# Patient Record
Sex: Male | Born: 1995 | Race: Black or African American | Hispanic: No | Marital: Single | Smoking: Never smoker
Health system: Southern US, Community
[De-identification: ages and names within clinical notes are randomized; demographics above are authoritative.]

---
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
## 2017-01-25 ENCOUNTER — Emergency Department (HOSPITAL_BASED_OUTPATIENT_CLINIC_OR_DEPARTMENT_OTHER)
Admission: EM | Admit: 2017-01-25 | Discharge: 2017-01-25 | Disposition: A | Discharge status: Home / Self Care | Payer: No Typology Code available for payment source | Attending: Emergency Medicine | Admitting: Emergency Medicine

## 2017-01-25 ENCOUNTER — Encounter (HOSPITAL_BASED_OUTPATIENT_CLINIC_OR_DEPARTMENT_OTHER): Payer: Self-pay

## 2017-01-25 DIAGNOSIS — Y999 Unspecified external cause status: Secondary | ICD-10-CM | POA: Diagnosis not present

## 2017-01-25 DIAGNOSIS — T148XXA Other injury of unspecified body region, initial encounter: Secondary | ICD-10-CM

## 2017-01-25 DIAGNOSIS — Y929 Unspecified place or not applicable: Secondary | ICD-10-CM | POA: Diagnosis not present

## 2017-01-25 DIAGNOSIS — Y939 Activity, unspecified: Secondary | ICD-10-CM | POA: Diagnosis not present

## 2017-01-25 DIAGNOSIS — S29012A Strain of muscle and tendon of back wall of thorax, initial encounter: Secondary | ICD-10-CM | POA: Insufficient documentation

## 2017-01-25 DIAGNOSIS — S3992XA Unspecified injury of lower back, initial encounter: Secondary | ICD-10-CM | POA: Diagnosis present

## 2017-01-25 NOTE — Discharge Instructions (Signed)
You can take 600 mg of ibuprofen 3-4 times a day as needed OR 220-440mg  naproxen twice a day. Also use heat therapy, massage therapy, stretching exercises, and topical creams such as [employer]akcey Hot, TryonSalonpas, MoultonBengay.
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
## 2017-10-22 ENCOUNTER — Other Ambulatory Visit (INDEPENDENT_AMBULATORY_CARE_PROVIDER_SITE_OTHER): Payer: 59

## 2017-10-22 ENCOUNTER — Encounter: Payer: Self-pay | Admitting: Internal Medicine

## 2017-10-22 ENCOUNTER — Ambulatory Visit (INDEPENDENT_AMBULATORY_CARE_PROVIDER_SITE_OTHER): Payer: 59 | Admitting: Internal Medicine

## 2017-10-22 VITALS — BP 116/74 | HR 86 | Temp 98.0°F | Ht 72.0 in | Wt 198.0 lb

## 2017-10-22 DIAGNOSIS — Z114 Encounter for screening for human immunodeficiency virus [HIV]: Secondary | ICD-10-CM

## 2017-10-22 DIAGNOSIS — Z23 Encounter for immunization: Secondary | ICD-10-CM | POA: Diagnosis not present

## 2017-10-22 DIAGNOSIS — Z0000 Encounter for general adult medical examination without abnormal findings: Secondary | ICD-10-CM

## 2017-10-22 LAB — CBC WITH DIFFERENTIAL/PLATELET
Basophils Absolute: 0.1 10*3/uL (ref 0.0–0.1)
Basophils Relative: 0.7 % (ref 0.0–3.0)
EOS PCT: 0.2 % (ref 0.0–5.0)
Eosinophils Absolute: 0 10*3/uL (ref 0.0–0.7)
HEMATOCRIT: 43.2 % (ref 39.0–52.0)
HEMOGLOBIN: 14.6 g/dL (ref 13.0–17.0)
Lymphocytes Relative: 29.8 % (ref 12.0–46.0)
Lymphs Abs: 2.6 10*3/uL (ref 0.7–4.0)
MCHC: 33.8 g/dL (ref 30.0–36.0)
MCV: 86.1 fl (ref 78.0–100.0)
MONOS PCT: 7.6 % (ref 3.0–12.0)
Monocytes Absolute: 0.7 10*3/uL (ref 0.1–1.0)
Neutro Abs: 5.3 10*3/uL (ref 1.4–7.7)
Neutrophils Relative %: 61.7 % (ref 43.0–77.0)
Platelets: 250 10*3/uL (ref 150.0–400.0)
RBC: 5.01 Mil/uL (ref 4.22–5.81)
RDW: 12.5 % (ref 11.5–15.5)
WBC: 8.7 10*3/uL (ref 4.0–10.5)

## 2017-10-22 LAB — LIPID PANEL
CHOL/HDL RATIO: 3
Cholesterol: 156 mg/dL (ref 0–200)
HDL: 46 mg/dL (ref >39.00)
LDL Cholesterol: 97 mg/dL (ref 0–99)
NonHDL: 109.5
Triglycerides: 62 mg/dL (ref 0.0–149.0)
VLDL: 12.4 mg/dL (ref 0.0–40.0)

## 2017-10-22 LAB — HEPATIC FUNCTION PANEL
ALBUMIN: 4.2 g/dL (ref 3.5–5.2)
ALK PHOS: 99 U/L (ref 39–117)
ALT: 11 U/L (ref 0–53)
AST: 25 U/L (ref 0–37)
BILIRUBIN DIRECT: 0.1 mg/dL (ref 0.0–0.3)
Total Bilirubin: 0.7 mg/dL (ref 0.2–1.2)
Total Protein: 7.1 g/dL (ref 6.0–8.3)

## 2017-10-22 LAB — BASIC METABOLIC PANEL
BUN: 14 mg/dL (ref 6–23)
CALCIUM: 9.3 mg/dL (ref 8.4–10.5)
CO2: 29 meq/L (ref 19–32)
CREATININE: 1.28 mg/dL (ref 0.40–1.50)
Chloride: 103 mEq/L (ref 96–112)
GFR: 89.11 mL/min (ref >60.00)
GLUCOSE: 85 mg/dL (ref 70–99)
Potassium: 3.6 mEq/L (ref 3.5–5.1)
Sodium: 139 mEq/L (ref 135–145)

## 2017-10-22 LAB — URINALYSIS, ROUTINE W REFLEX MICROSCOPIC
Bilirubin Urine: NEGATIVE
Hgb urine dipstick: NEGATIVE
Leukocytes, UA: NEGATIVE
NITRITE: NEGATIVE
RBC / HPF: NONE SEEN (ref 0–?)
Total Protein, Urine: NEGATIVE
URINE GLUCOSE: NEGATIVE
Urobilinogen, UA: 1 (ref 0.0–1.0)
pH: 5.5 (ref 5.0–8.0)

## 2017-10-22 LAB — TSH: TSH: 0.49 u[IU]/mL (ref 0.35–4.50)

## 2017-10-22 NOTE — Patient Instructions (Addendum)
You had the Tdap tetanus and flu shots today  Please continue all other medications as before, and refills have been done if requested.  Please have the pharmacy call with any other refills you may need.  Please continue your efforts at being more active, low cholesterol diet, and weight control.  You are otherwise up to date with prevention measures today.  Please keep your appointments with your specialists as you may have planned  Your forms will be filled out  Please go to the LAB in the Basement (turn left off the elevator) for the tests to be done today  You will be contacted by phone if any changes need to be made immediately.  Otherwise, you will receive a letter about your results with an explanation, but please check with MyChart first.  Please remember to sign up for MyChart if you have not done so, as this will be important to you in the future with finding out test results, communicating by private email, and scheduling acute appointments online when needed.  Please return in 1 year for your yearly visit, or as needed

## 2017-10-22 NOTE — Assessment & Plan Note (Signed)

## 2017-10-22 NOTE — Progress Notes (Signed)
Subjective:    Patient ID: Stephen ChadSteven Bury Jr., male    DOB: 06/04/1996, 22 y.o.   MRN: [id number]  HPI  Here for wellness and f/u;  Overall doing ok;  Pt denies Chest pain, worsening SOB, DOE, wheezing, orthopnea, PND, worsening LE edema, palpitations, dizziness or syncope.  Pt denies neurological change such as new headache, facial or extremity weakness.  Pt denies polydipsia, polyuria, or low sugar symptoms. Pt states overall good compliance with treatment and medications, good tolerability, and has been trying to follow appropriate diet.  Pt denies worsening depressive symptoms, suicidal ideation or panic. No fever, night sweats, wt loss, loss of appetite, or other constitutional symptoms.  Pt states good ability with ADL's, has low fall risk, home safety reviewed and adequate, no other significant changes in hearing or vision, and  occasionally active with exercise. 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  No interval hx or new complaints No past medical history on file. No past surgical history on file.  reports that  has never smoked. he has never used smokeless tobacco. He reports that he drinks alcohol. He reports that he does not use drugs. family history includes Diabetes in his father. No Known Allergies Current Outpatient Medications on File Prior to Visit  Medication Sig Dispense Refill  . ibuprofen (ADVIL,MOTRIN) 200 MG tablet Take 2-4 mg by mouth every 6 (six) hours as needed. For pain       No current facility-administered medications on file prior to visit.    Review of Systems Constitutional: Negative for other unusual diaphoresis, sweats, appetite or weight changes HENT: Negative for other worsening hearing loss, ear pain, facial swelling, mouth sores or neck stiffness.   Eyes: Negative for other worsening pain, redness or other visual disturbance.    Respiratory: Negative for other stridor or swelling Cardiovascular: Negative for other palpitations or other chest pain  Gastrointestinal: Negative for worsening diarrhea or loose stools, blood in stool, distention or other pain Genitourinary: Negative for hematuria, flank pain or other change in urine volume.  Musculoskeletal: Negative for myalgias or other joint swelling.  Skin: Negative for other color change, or other wound or worsening drainage.  Neurological: Negative for other syncope or numbness. Hematological: Negative for other adenopathy or swelling Psychiatric/Behavioral: Negative for hallucinations, other worsening agitation, SI, self-injury, or new decreased concentration All other system neg per pt    Objective:   Physical Exam BP 116/74   Pulse 86   Temp 98 F (36.7 C) (Oral)   Ht 6' (1.829 m)   Wt 198 lb (89.8 kg)   SpO2 97%   BMI 26.85 kg/m  VS noted,  Constitutional: Pt is oriented to person, place, and time. Appears well-developed and well-nourished, in no significant distress and comfortable Head: Normocephalic and atraumatic  Eyes: Conjunctivae and EOM are normal. Pupils are equal, round, and reactive to light Right Ear: External ear normal without discharge Left Ear: External ear normal without discharge Nose: Nose without discharge or deformity Mouth/Throat: Oropharynx is without other ulcerations and moist  Neck: Normal range of motion. Neck supple. No JVD present. No tracheal deviation present or significant neck LA or mass Cardiovascular: Normal rate, regular rhythm, normal heart sounds and intact distal pulses.   Pulmonary/Chest: WOB normal and breath sounds without rales or wheezing  Abdominal: Soft. Bowel sounds are normal. NT. No HSM  Musculoskeletal: Normal  range of motion. Exhibits no edema Lymphadenopathy: Has no other cervical adenopathy.  Neurological: Pt is alert and oriented to person, place, and time. Pt has normal reflexes. No cranial  nerve deficit. Motor grossly intact, Gait intact Skin: Skin is warm and dry. No rash noted or new ulcerations Psychiatric:  Has normal mood and affect. Behavior is normal without agitation No other exam findings Lab Results  Component Value Date   WBC 8.7 10/22/2017   HGB 14.6 10/22/2017   HCT 43.2 10/22/2017   PLT 250.0 10/22/2017   GLUCOSE 85 10/22/2017   CHOL 156 10/22/2017   TRIG 62.0 10/22/2017   HDL 46.00 10/22/2017   LDLCALC 97 10/22/2017   ALT 11 10/22/2017   AST 25 10/22/2017   NA 139 10/22/2017   K 3.6 10/22/2017   CL 103 10/22/2017   CREATININE 1.28 10/22/2017   BUN 14 10/22/2017   CO2 29 10/22/2017   TSH 0.49 10/22/2017       Assessment & Plan:

## 2017-10-23 LAB — HIV ANTIBODY (ROUTINE TESTING W REFLEX): HIV: NONREACTIVE

## 2018-09-12 ENCOUNTER — Other Ambulatory Visit: Payer: Self-pay | Admitting: Family Medicine

## 2018-09-12 ENCOUNTER — Ambulatory Visit
Admission: RE | Admit: 2018-09-12 | Discharge: 2018-09-12 | Disposition: A | Discharge status: Home / Self Care | Payer: Self-pay | Source: Ambulatory Visit | Attending: Family Medicine | Admitting: Family Medicine

## 2018-09-12 DIAGNOSIS — Z021 Encounter for pre-employment examination: Secondary | ICD-10-CM

## 2019-02-20 IMAGING — DX DG CHEST 1V
1 series · 1 of 1 positions shown · non-contrast
Comparison: None.

CLINICAL DATA: Pre-employment physical examination

EXAM:
CHEST  1 VIEW

[dg chest 1 view]
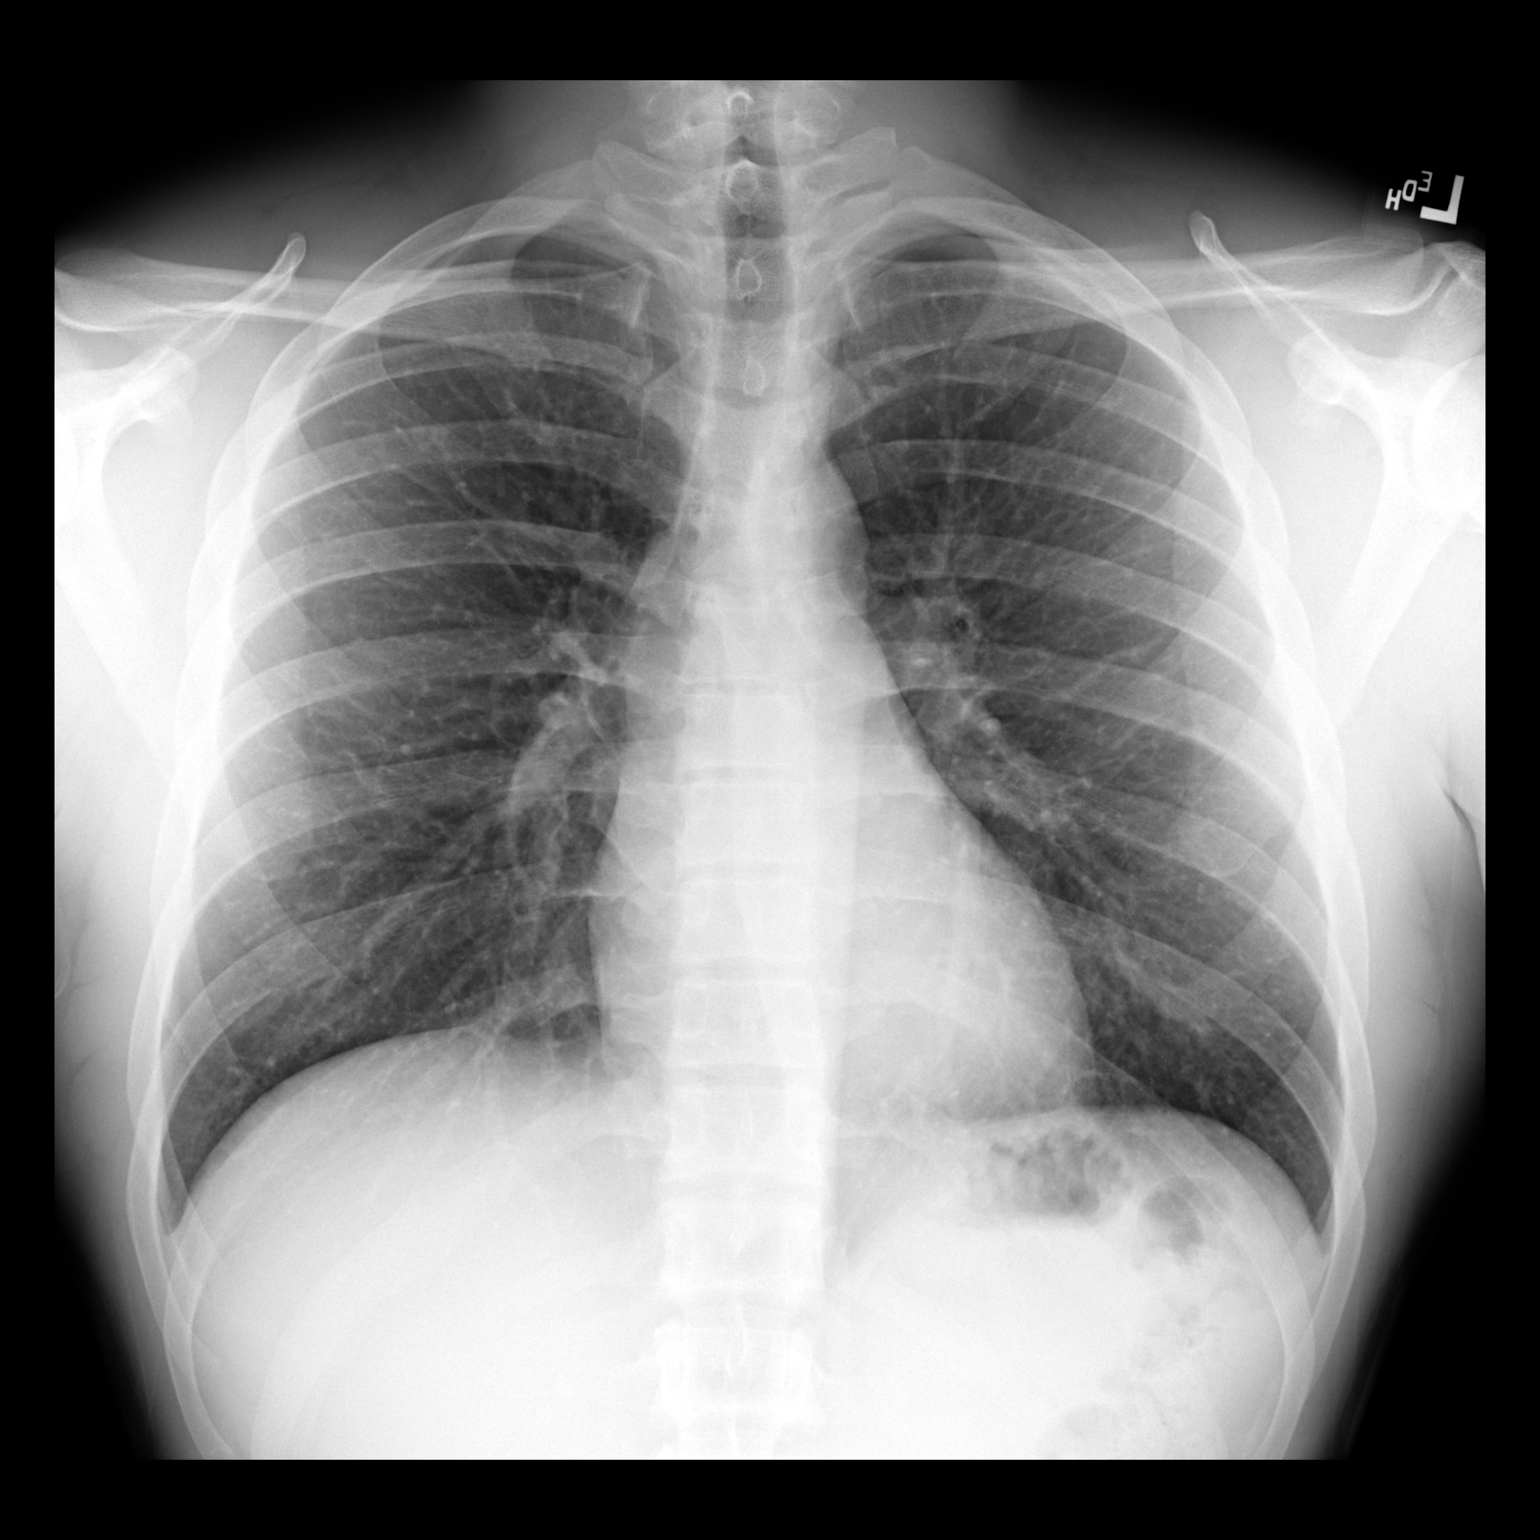

[1 of 1 positions shown; findings below may reference images not displayed]

FINDINGS: Lungs are clear. Heart size and pulmonary vascularity are normal. No
adenopathy. No bone lesions.
IMPRESSION: No abnormality noted.

## 2019-12-30 ENCOUNTER — Other Ambulatory Visit: Payer: No Typology Code available for payment source

## 2021-05-02 NOTE — Progress Notes (Signed)
    Subjective:    Patient ID: Stephen Marczak., male    DOB: 02/08/1996, 25 y.o.   MRN: [id number]  HPI The patient is here for an acute visit.   7-8 months on back - going on vacation soon - wants it removed.  No pain or itching. Tried to get in with derm but could not get an appt until Jan.    Medications and allergies reviewed with patient and updated if appropriate.  Patient Active Problem List   Diagnosis Date Noted   Preventative health care 10/22/2017   Left knee injury 09/24/2011    No current outpatient medications on file prior to visit.   No current facility-administered medications on file prior to visit.    History reviewed. No pertinent past medical history.  History reviewed. No pertinent surgical history.  Social History   Socioeconomic History   Marital status: Single    Spouse name: Not on file   Number of children: Not on file   Years of education: Not on file   Highest education level: Not on file  Occupational History   Not on file  Tobacco Use   Smoking status: Never   Smokeless tobacco: Never  Vaping Use   Vaping Use: Never used  Substance and Sexual Activity   Alcohol use: Yes    Comment: 1-2 beers per month   Drug use: No   Sexual activity: Not on file  Other Topics Concern   Not on file  Social History Narrative   ** Merged History Encounter **       Social Determinants of Health   Financial Resource Strain: Not on file  Food Insecurity: Not on file  Transportation Needs: Not on file  Physical Activity: Not on file  Stress: Not on file  Social Connections: Not on file    Family History  Problem Relation Age of Onset   Diabetes Father    Heart attack Neg Hx    Hyperlipidemia Neg Hx    Hypertension Neg Hx    Sudden death Neg Hx     Review of Systems     Objective:   Vitals:   05/03/21 0958  BP: 108/72  Pulse: (!) 53  Temp: 98.3 F (36.8 C)  SpO2: 99%   BP Readings from Last 3 Encounters:  05/03/21 108/72   10/22/17 116/74  01/25/17 129/85   Wt Readings from Last 3 Encounters:  05/03/21 207 lb 3.2 oz (94 kg)  10/22/17 198 lb (89.8 kg)  01/25/17 185 lb (83.9 kg)   Body mass index is 28.1 kg/m.   Physical Exam Constitutional:      General: He is not in acute distress.    Appearance: Normal appearance. He is not ill-appearing.  Skin:    General: Skin is warm and dry.     Findings: Lesion (pea sized sebaceous cyst left mid back with black head - no erythema or tenderness, no fluctuance) present.  Neurological:     Mental Status: He is alert.           Assessment & Plan:    Sebaceous cyst on back: Acute Would like it removed Referral ordered for derm   This visit occurred during the SARS-CoV-2 public health emergency.  Safety protocols were in place, including screening questions prior to the visit, additional usage of staff PPE, and extensive cleaning of exam room while observing appropriate contact time as indicated for disinfecting solutions.

## 2021-05-03 ENCOUNTER — Encounter: Payer: Self-pay | Admitting: Internal Medicine

## 2021-05-03 ENCOUNTER — Other Ambulatory Visit: Payer: Self-pay

## 2021-05-03 ENCOUNTER — Ambulatory Visit (INDEPENDENT_AMBULATORY_CARE_PROVIDER_SITE_OTHER): Payer: BC Managed Care – PPO | Admitting: Internal Medicine

## 2021-05-03 VITALS — BP 108/72 | HR 53 | Temp 98.3°F | Ht 72.0 in | Wt 207.2 lb

## 2021-05-03 DIAGNOSIS — L723 Sebaceous cyst: Secondary | ICD-10-CM | POA: Diagnosis not present

## 2021-05-03 NOTE — Patient Instructions (Addendum)
   You have a sebaceous cyst on your back   A referral was ordered for Dr Nita Sells dermatology.       Someone from their office will call you to schedule an appointment.   Dr Nita Sells Address: [address], [place], [place] [address] Phone: [phone]

## 2021-05-23 DIAGNOSIS — L720 Epidermal cyst: Secondary | ICD-10-CM | POA: Diagnosis not present

## 2021-12-27 DIAGNOSIS — L720 Epidermal cyst: Secondary | ICD-10-CM | POA: Diagnosis not present

## 2022-12-10 ENCOUNTER — Ambulatory Visit (INDEPENDENT_AMBULATORY_CARE_PROVIDER_SITE_OTHER): Payer: 59 | Admitting: Internal Medicine

## 2022-12-10 VITALS — BP 106/78 | HR 57 | Temp 97.6°F | Ht 72.0 in | Wt 214.2 lb

## 2022-12-10 DIAGNOSIS — E538 Deficiency of other specified B group vitamins: Secondary | ICD-10-CM

## 2022-12-10 DIAGNOSIS — J309 Allergic rhinitis, unspecified: Secondary | ICD-10-CM | POA: Insufficient documentation

## 2022-12-10 DIAGNOSIS — Z0001 Encounter for general adult medical examination with abnormal findings: Secondary | ICD-10-CM

## 2022-12-10 DIAGNOSIS — Z0000 Encounter for general adult medical examination without abnormal findings: Secondary | ICD-10-CM | POA: Diagnosis not present

## 2022-12-10 DIAGNOSIS — E559 Vitamin D deficiency, unspecified: Secondary | ICD-10-CM | POA: Diagnosis not present

## 2022-12-10 DIAGNOSIS — Z1159 Encounter for screening for other viral diseases: Secondary | ICD-10-CM | POA: Diagnosis not present

## 2022-12-10 LAB — URINALYSIS, ROUTINE W REFLEX MICROSCOPIC
Bilirubin Urine: NEGATIVE
Hgb urine dipstick: NEGATIVE
Ketones, ur: NEGATIVE
Leukocytes,Ua: NEGATIVE
Nitrite: NEGATIVE
RBC / HPF: NONE SEEN (ref 0–?)
Specific Gravity, Urine: 1.02 (ref 1.000–1.030)
Total Protein, Urine: NEGATIVE
Urine Glucose: NEGATIVE
Urobilinogen, UA: 1 (ref 0.0–1.0)
WBC, UA: NONE SEEN (ref 0–?)
pH: 7.5 (ref 5.0–8.0)

## 2022-12-10 LAB — CBC WITH DIFFERENTIAL/PLATELET
Basophils Absolute: 0 10*3/uL (ref 0.0–0.1)
Basophils Relative: 0.3 % (ref 0.0–3.0)
Eosinophils Absolute: 0.1 10*3/uL (ref 0.0–0.7)
Eosinophils Relative: 1.8 % (ref 0.0–5.0)
HCT: 46 % (ref 39.0–52.0)
Hemoglobin: 15.3 g/dL (ref 13.0–17.0)
Lymphocytes Relative: 42.3 % (ref 12.0–46.0)
Lymphs Abs: 2 10*3/uL (ref 0.7–4.0)
MCHC: 33.3 g/dL (ref 30.0–36.0)
MCV: 88.1 fl (ref 78.0–100.0)
Monocytes Absolute: 0.6 10*3/uL (ref 0.1–1.0)
Monocytes Relative: 13.7 % — ABNORMAL HIGH (ref 3.0–12.0)
Neutro Abs: 1.9 10*3/uL (ref 1.4–7.7)
Neutrophils Relative %: 41.9 % — ABNORMAL LOW (ref 43.0–77.0)
Platelets: 217 10*3/uL (ref 150.0–400.0)
RBC: 5.22 Mil/uL (ref 4.22–5.81)
RDW: 12.8 % (ref 11.5–15.5)
WBC: 4.6 10*3/uL (ref 4.0–10.5)

## 2022-12-10 LAB — BASIC METABOLIC PANEL
BUN: 14 mg/dL (ref 6–23)
CO2: 29 mEq/L (ref 19–32)
Calcium: 9.4 mg/dL (ref 8.4–10.5)
Chloride: 102 mEq/L (ref 96–112)
Creatinine, Ser: 0.99 mg/dL (ref 0.40–1.50)
GFR: 103.51 mL/min (ref >60.00)
Glucose, Bld: 94 mg/dL (ref 70–99)
Potassium: 4.2 mEq/L (ref 3.5–5.1)
Sodium: 138 mEq/L (ref 135–145)

## 2022-12-10 LAB — LIPID PANEL
Cholesterol: 169 mg/dL (ref 0–200)
HDL: 57.8 mg/dL (ref >39.00)
LDL Cholesterol: 96 mg/dL (ref 0–99)
NonHDL: 111.01
Total CHOL/HDL Ratio: 3
Triglycerides: 73 mg/dL (ref 0.0–149.0)
VLDL: 14.6 mg/dL (ref 0.0–40.0)

## 2022-12-10 LAB — HEPATIC FUNCTION PANEL
ALT: 13 U/L (ref 0–53)
AST: 18 U/L (ref 0–37)
Albumin: 4.4 g/dL (ref 3.5–5.2)
Alkaline Phosphatase: 108 U/L (ref 39–117)
Bilirubin, Direct: 0.1 mg/dL (ref 0.0–0.3)
Total Bilirubin: 0.6 mg/dL (ref 0.2–1.2)
Total Protein: 7.5 g/dL (ref 6.0–8.3)

## 2022-12-10 NOTE — Progress Notes (Unsigned)
Patient ID: Stephen Butzlaff., male   DOB: Oct 26, 1995, 27 y.o.   MRN: [id number]         Chief Complaint:: wellness exam and allergies       HPI:  Stephen Such. is a 27 y.o. male here for wellness exam; declines flu shot, o/w up to date                        Also Pt denies chest pain, increased sob or doe, wheezing, orthopnea, PND, increased LE swelling, palpitations, dizziness or syncope.   Pt denies polydipsia, polyuria, or new focal neuro s/s.    Pt denies fever, wt loss, night sweats, loss of appetite, or other constitutional symptoms  Does have several wks ongoing nasal allergy symptoms with clearish congestion, itch and sneezing, without fever, pain, ST, cough, swelling or wheezing.   Wt Readings from Last 3 Encounters:  12/10/22 214 lb 4 oz (97.2 kg)  05/03/21 207 lb 3.2 oz (94 kg)  10/22/17 198 lb (89.8 kg)   BP Readings from Last 3 Encounters:  12/10/22 106/78  05/03/21 108/72  10/22/17 116/74   Immunization History  Administered Date(s) Administered   Influenza,inj,Quad PF,6+ Mos 10/22/2017   PPD Test 10/17/2011   Tdap 10/22/2017   There are no preventive care reminders to display for this patient.     History reviewed. No pertinent past medical history. History reviewed. No pertinent surgical history.  reports that he has never smoked. He has never used smokeless tobacco. He reports current alcohol use. He reports that he does not use drugs. family history includes Diabetes in his father. No Known Allergies No current outpatient medications on file prior to visit.   No current facility-administered medications on file prior to visit.        ROS:  All others reviewed and negative.  Objective        PE:  BP 106/78   Pulse (!) 57   Temp 97.6 F (36.4 C) (Temporal)   Ht 6' (1.829 m)   Wt 214 lb 4 oz (97.2 kg)   SpO2 97%   BMI 29.06 kg/m                 Constitutional: Pt appears in NAD               HENT: Head: NCAT.                Right Ear:  External ear normal.                 Left Ear: External ear normal.                Eyes: . Pupils are equal, round, and reactive to light. Conjunctivae and EOM are normal               Nose: without d/c or deformity               Neck: Neck supple. Gross normal ROM               Cardiovascular: Normal rate and regular rhythm.                 Pulmonary/Chest: Effort normal and breath sounds without rales or wheezing.                Abd:  Soft, NT, ND, + BS, no organomegaly  Neurological: Pt is alert. At baseline orientation, motor grossly intact               Skin: Skin is warm. No rashes, no other new lesions, LE edema - none               Psychiatric: Pt behavior is normal without agitation   Micro: none  Cardiac tracings I have personally interpreted today:  none  Pertinent Radiological findings (summarize): none   Lab Results  Component Value Date   WBC 4.6 12/10/2022   HGB 15.3 12/10/2022   HCT 46.0 12/10/2022   PLT 217.0 12/10/2022   GLUCOSE 94 12/10/2022   CHOL 169 12/10/2022   TRIG 73.0 12/10/2022   HDL 57.80 12/10/2022   LDLCALC 96 12/10/2022   ALT 13 12/10/2022   AST 18 12/10/2022   NA 138 12/10/2022   K 4.2 12/10/2022   CL 102 12/10/2022   CREATININE 0.99 12/10/2022   BUN 14 12/10/2022   CO2 29 12/10/2022   TSH 1.09 12/10/2022   Assessment/Plan:  Stephen Perezgarcia. is a 27 y.o. Black or African American [2] male with  has no past medical history on file.  Preventative health care Age and sex appropriate education and counseling updated with regular exercise and diet Referrals for preventative services - none needed Immunizations addressed - declines flu shot Smoking counseling  - none needed Evidence for depression or other mood disorder - none significant Most recent labs reviewed. I have personally reviewed and have noted: 1) the patient's medical and social history 2) The patient's current medications and supplements 3) The patient's  height, weight, and BMI have been recorded in the chart   Allergic rhinitis Mild for add zyrtec and nasacort asd,  to f/u any worsening symptoms or concerns   Vitamin D deficiency Last vitamin D Lab Results  Component Value Date   VD25OH 15.69 (L) 12/10/2022   Low, to start oral replacement  Followup: Return in about 1 year (around 12/10/2023).  Cathlean Cower, MD 12/12/2022 1:07 PM [facility]

## 2022-12-10 NOTE — Patient Instructions (Signed)
Ok to take OTC Allegra or Zyrtec, as well as OTC Nasacort for allergies if needed  Please continue all other medications as before, and refills have been done if requested.  Please have the pharmacy call with any other refills you may need.  Please continue your efforts at being more active, low cholesterol diet, and weight control.  You are otherwise up to date with prevention measures today.  Please keep your appointments with your specialists as you may have planned  Please go to the LAB at the blood drawing area for the tests to be done  You will be contacted by phone if any changes need to be made immediately.  Otherwise, you will receive a letter about your results with an explanation, but please check with MyChart first.  Please remember to sign up for MyChart if you have not done so, as this will be important to you in the future with finding out test results, communicating by private email, and scheduling acute appointments online when needed.  Please make an Appointment to return for your 1 year visit, or sooner if needed

## 2022-12-11 ENCOUNTER — Encounter: Payer: Self-pay | Admitting: Internal Medicine

## 2022-12-11 LAB — HEPATITIS C ANTIBODY: Hepatitis C Ab: NONREACTIVE

## 2022-12-11 LAB — VITAMIN D 25 HYDROXY (VIT D DEFICIENCY, FRACTURES): VITD: 15.69 ng/mL — ABNORMAL LOW (ref 30.00–100.00)

## 2022-12-11 LAB — VITAMIN B12: Vitamin B-12: 234 pg/mL (ref 211–911)

## 2022-12-11 LAB — TSH: TSH: 1.09 u[IU]/mL (ref 0.35–5.50)

## 2022-12-12 ENCOUNTER — Encounter: Payer: Self-pay | Admitting: Internal Medicine

## 2022-12-12 DIAGNOSIS — E559 Vitamin D deficiency, unspecified: Secondary | ICD-10-CM | POA: Insufficient documentation

## 2022-12-12 NOTE — Assessment & Plan Note (Signed)
Mild for add zyrtec and nasacort asd,  to f/u any worsening symptoms or concerns

## 2022-12-12 NOTE — Assessment & Plan Note (Signed)
Last vitamin D Lab Results  Component Value Date   VD25OH 15.69 (L) 12/10/2022   Low, to start oral replacement

## 2022-12-12 NOTE — Assessment & Plan Note (Signed)
Age and sex appropriate education and counseling updated with regular exercise and diet Referrals for preventative services - none needed Immunizations addressed - declines flu shot Smoking counseling  - none needed Evidence for depression or other mood disorder - none significant Most recent labs reviewed. I have personally reviewed and have noted: 1) the patient's medical and social history 2) The patient's current medications and supplements 3) The patient's height, weight, and BMI have been recorded in the chart  

## 2023-01-29 ENCOUNTER — Encounter: Payer: Self-pay | Admitting: Internal Medicine

## 2023-12-12 ENCOUNTER — Encounter: Payer: Managed Care, Other (non HMO) | Admitting: Internal Medicine
